# Patient Record
Sex: Male | Born: 2000
Health system: Southern US, Community
[De-identification: ages and names within clinical notes are randomized; demographics above are authoritative.]

---
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
## 2024-05-04 ENCOUNTER — Emergency Department (HOSPITAL_BASED_OUTPATIENT_CLINIC_OR_DEPARTMENT_OTHER)
Admission: EM | Admit: 2024-05-04 | Discharge: 2024-05-04 | Disposition: A | Discharge status: Home / Self Care | Attending: Emergency Medicine | Admitting: Emergency Medicine

## 2024-05-04 ENCOUNTER — Other Ambulatory Visit: Payer: Self-pay

## 2024-05-04 ENCOUNTER — Encounter (HOSPITAL_BASED_OUTPATIENT_CLINIC_OR_DEPARTMENT_OTHER): Payer: Self-pay

## 2024-05-04 DIAGNOSIS — Y990 Civilian activity done for income or pay: Secondary | ICD-10-CM | POA: Diagnosis not present

## 2024-05-04 DIAGNOSIS — S0502XA Injury of conjunctiva and corneal abrasion without foreign body, left eye, initial encounter: Secondary | ICD-10-CM | POA: Diagnosis present

## 2024-05-04 DIAGNOSIS — X58XXXA Exposure to other specified factors, initial encounter: Secondary | ICD-10-CM | POA: Insufficient documentation

## 2024-05-04 MED ORDER — POLYMYXIN B-TRIMETHOPRIM 10000-0.1 UNIT/ML-% OP SOLN
1.0000 [drp] | OPHTHALMIC | Status: DC
  Administered 2024-05-04: 1 [drp] via OPHTHALMIC
  Filled 2024-05-04: qty 10

## 2024-05-04 MED ORDER — TETRACAINE HCL 0.5 % OP SOLN
2.0000 [drp] | Freq: Once | OPHTHALMIC | Status: AC
  Administered 2024-05-04: 2 [drp] via OPHTHALMIC
  Filled 2024-05-04: qty 4

## 2024-05-04 MED ORDER — FLUORESCEIN SODIUM 1 MG OP STRP
1.0000 | ORAL_STRIP | Freq: Once | OPHTHALMIC | Status: AC
  Administered 2024-05-04: 1 via OPHTHALMIC
  Filled 2024-05-04: qty 1

## 2024-05-04 NOTE — ED Provider Notes (Signed)
 [place] EMERGENCY DEPARTMENT AT [facility] Provider Note   CSN: [id number] Arrival date & time: 05/04/24  8165     Patient presents with: Eye Problem   Adam Morris is a 23 y.o. male.   The history is provided by the patient and medical records. No language interpreter was used.  Eye Problem Associated symptoms: discharge, photophobia and redness   Associated symptoms: no itching      23 year old male presenting with complaints of eye irritation.  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  States he was wearing safety goggles at the time.  He endorsed progressive worsening irritation and foreign body sensation in his left eye with blurry vision from excessive tearing.  He denies any loss of vision or double vision.  He does not wear contact lens.  He does not have an eye specialist.  He is unsure of his last tetanus status.  Prior to Admission medications   Not on File    Allergies: Patient has no known allergies.    Review of Systems  Constitutional:  Negative for fever.  Eyes:  Positive for photophobia, pain, discharge and redness. Negative for itching and visual disturbance.    Updated Vital Signs BP (!) 149/92 (BP Location: Right Arm)   Pulse 84   Temp 98.9 F (37.2 C) (Oral)   Resp 18   Ht 5' 6 (1.676 m)   Wt 63.5 kg   SpO2 99%   BMI 22.60 kg/m   Physical Exam Constitutional:      General: He is not in acute distress.    Appearance: He is well-developed.  HENT:     Head: Atraumatic.  Eyes:     General: Lids are normal. Lids are everted, no foreign bodies appreciated. Vision grossly intact. Gaze aligned appropriately.        Left eye: No foreign body or hordeolum.     Conjunctiva/sclera:     Left eye: Left conjunctiva is injected. No chemosis, exudate or hemorrhage.    Pupils:     Left eye: Corneal abrasion present.     Slit lamp exam:    Left eye:  No corneal flare, corneal ulcer, foreign body, hyphema, hypopyon or photophobia.      Comments: Visual Acuity Bilateral Distance: 20/20  R Distance: 20/20  L Distance: 20/25     Musculoskeletal:     Cervical back: Normal range of motion and neck supple.  Skin:    Findings: No rash.  Neurological:     Mental Status: He is alert.     (all labs ordered are listed, but only abnormal results are displayed) Labs Reviewed - No data to display  EKG: None  Radiology: No results found.   Procedures   Medications Ordered in the ED  tetracaine  (PONTOCAINE) 0.5 % ophthalmic solution 2 drop (2 drops Left Eye Given 05/04/24 1853)  fluorescein  ophthalmic strip 1 strip (1 strip Left Eye Given 05/04/24 1853)                                    Medical Decision Making Risk Prescription drug management.   BP (!) 149/92 (BP Location: Right Arm)   Pulse 84   Temp 98.9 F (37.2 C) (Oral)   Resp 18   Ht 5' 6 (1.676 m)   Wt 63.5 kg  SpO2 99%   BMI 22.60 kg/m   79:33 PM  23 year old male presenting with complaints of eye irritation.  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  States he was wearing safety goggles at the time.  He endorsed progressive worsening irritation and foreign body sensation in his left eye with blurry vision from excessive tearing.  He denies any loss of vision or double vision.  He does not wear contact lens.  He does not have an eye specialist.  He is unsure of his last tetanus status.  On exam patient does exhibit some irritation about his left eye.  Left conjunctiva is injected.  There is fluorescein  stain uptake consistent with a corneal abrasion noted to the lateral lower portion of the lens.  No foreign body noted.  Pupils equal round reactive and extraocular movements intact.  Finding consistent with corneal abrasion.  No foreign body noted.  No evidence of iritis,  uveitis, doubt acute angle glaucoma or cataract.  No corneal ulcer.  Will treat with Polytrim  and will give patient referral to ophthalmology for outpatient management.     Final diagnoses:  Corneal abrasion, left, initial encounter    ED Discharge Orders     None          Nivia Colon, PA-C 05/04/24 2020    Lenor Hollering, MD 05/04/24 [phone]

## 2024-05-04 NOTE — Discharge Instructions (Signed)
 You have been evaluated for your eye irritation.  There is evidence of a corneal abrasion involving the left eye.  Please use Polytrim  eyedrops 1 drop to left eye every 4 hours for the next 5 days to treat for potential infection.  You may take over-the-counter Tylenol  or ibuprofen as needed for pain.  You may follow-up closely with eye specialist if your symptoms persist.

## 2024-05-04 NOTE — ED Triage Notes (Signed)
 Pt reports possibly getting something in L eye x3 days ago. Pt reports wearing safety goggles but reports irritation. Pt reports slight blurriness.
